# Patient Record
Sex: Female | Born: 1970 | Race: Black or African American | Hispanic: No | Smoking: Former smoker
Health system: Southern US, Community
[De-identification: ages and names within clinical notes are randomized; demographics above are authoritative.]

## PROBLEM LIST (undated history)

## (undated) DIAGNOSIS — G43909 Migraine, unspecified, not intractable, without status migrainosus: Secondary | ICD-10-CM

## (undated) HISTORY — PX: DILATION AND CURETTAGE OF UTERUS: SHX78

---

## 2017-02-13 ENCOUNTER — Emergency Department (HOSPITAL_BASED_OUTPATIENT_CLINIC_OR_DEPARTMENT_OTHER)
Admission: EM | Admit: 2017-02-13 | Discharge: 2017-02-13 | Disposition: A | Discharge status: Home / Self Care | Payer: Self-pay | Attending: Emergency Medicine | Admitting: Emergency Medicine

## 2017-02-13 ENCOUNTER — Encounter (HOSPITAL_BASED_OUTPATIENT_CLINIC_OR_DEPARTMENT_OTHER): Payer: Self-pay | Admitting: Emergency Medicine

## 2017-02-13 DIAGNOSIS — IMO0002 Reserved for concepts with insufficient information to code with codable children: Secondary | ICD-10-CM

## 2017-02-13 DIAGNOSIS — M5126 Other intervertebral disc displacement, lumbar region: Secondary | ICD-10-CM | POA: Insufficient documentation

## 2017-02-13 DIAGNOSIS — Y929 Unspecified place or not applicable: Secondary | ICD-10-CM | POA: Insufficient documentation

## 2017-02-13 DIAGNOSIS — X58XXXA Exposure to other specified factors, initial encounter: Secondary | ICD-10-CM | POA: Insufficient documentation

## 2017-02-13 DIAGNOSIS — Y939 Activity, unspecified: Secondary | ICD-10-CM | POA: Insufficient documentation

## 2017-02-13 DIAGNOSIS — Y999 Unspecified external cause status: Secondary | ICD-10-CM | POA: Insufficient documentation

## 2017-02-13 MED ORDER — ONDANSETRON HCL 4 MG/2ML IJ SOLN
4.0000 mg | Freq: Once | INTRAMUSCULAR | Status: AC
  Administered 2017-02-13: 4 mg via INTRAVENOUS
  Filled 2017-02-13: qty 2

## 2017-02-13 MED ORDER — HYDROMORPHONE HCL 1 MG/ML IJ SOLN
1.0000 mg | Freq: Once | INTRAMUSCULAR | Status: AC
  Administered 2017-02-13: 1 mg via INTRAVENOUS
  Filled 2017-02-13: qty 1

## 2017-02-13 MED ORDER — KETOROLAC TROMETHAMINE 30 MG/ML IJ SOLN
30.0000 mg | Freq: Once | INTRAMUSCULAR | Status: AC
  Administered 2017-02-13: 30 mg via INTRAVENOUS
  Filled 2017-02-13: qty 1

## 2017-02-13 MED ORDER — PREDNISONE 20 MG PO TABS: ORAL_TABLET | ORAL | 0 refills | Status: AC

## 2017-02-13 MED ORDER — PREDNISONE 50 MG PO TABS
60.0000 mg | ORAL_TABLET | Freq: Once | ORAL | Status: AC
  Administered 2017-02-13: 23:00:00 60 mg via ORAL
  Filled 2017-02-13: qty 1

## 2017-02-13 MED ORDER — OXYCODONE-ACETAMINOPHEN 5-325 MG PO TABS
2.0000 | ORAL_TABLET | Freq: Once | ORAL | Status: AC
  Administered 2017-02-13: 2 via ORAL
  Filled 2017-02-13: qty 2

## 2017-02-13 MED ORDER — OXYCODONE-ACETAMINOPHEN 5-325 MG PO TABS: 1.0000 | ORAL_TABLET | ORAL | 0 refills | Status: AC | PRN

## 2017-02-13 MED ORDER — CYCLOBENZAPRINE HCL 10 MG PO TABS: 10.0000 mg | ORAL_TABLET | Freq: Three times a day (TID) | ORAL | 0 refills | Status: AC

## 2017-02-13 MED ORDER — METHOCARBAMOL 1000 MG/10ML IJ SOLN
500.0000 mg | Freq: Once | INTRAMUSCULAR | Status: AC
  Administered 2017-02-13: 500 mg via INTRAMUSCULAR
  Filled 2017-02-13: qty 10

## 2017-02-13 NOTE — ED Provider Notes (Signed)
[facility]-EMERGENCY DEPT Provider Note   CSN: [id number] Arrival date & time: 02/13/17  1854     History   Chief Complaint Chief Complaint  Patient presents with  . Back Pain    HPI Lindsay Smith is a 46 y.o. female.  HPI Patient carried a microwave upstairs at about 11 AM. She reports she's felt something "squish in her lower back". She reports she was able to get the microwave up the stairs and set it down but after that she developed excruciating pain in her lower back. She reports it radiates into her left buttock and leg. She reports since it started she has not been able to get into any kind of comfortable position and has been in severe pain. She denies her left leg is weak although she reports is too painful for her to try to walk on it. She denies history of similar episode. History reviewed. No pertinent past medical history.  There are no active problems to display for this patient.   History reviewed. No pertinent surgical history.  OB History    No data available       Home Medications    Prior to Admission medications   Medication Sig Start Date End Date Taking? Authorizing Provider  cyclobenzaprine (FLEXERIL) 10 MG tablet Take 1 tablet (10 mg total) by mouth 3 (three) times daily. 02/13/17   Arby Barrette, MD  oxyCODONE-acetaminophen (PERCOCET) 5-325 MG tablet Take 1-2 tablets by mouth every 4 (four) hours as needed. 02/13/17   Arby Barrette, MD  predniSONE (DELTASONE) 20 MG tablet 3 tabs po daily x 3 days, then 2 tabs x 3 days, then 1.5 tabs x 3 days, then 1 tab x 3 days, then 0.5 tabs x 3 days 02/13/17   Arby Barrette, MD    Family History History reviewed. No pertinent family history.  Social History Social History  Substance Use Topics  . Smoking status: Never Smoker  . Smokeless tobacco: Never Used  . Alcohol use No     Allergies   Patient has no known allergies.   Review of Systems Review of Systems 10 Systems reviewed and are negative  for acute change except as noted in the HPI.  Physical Exam Updated Vital Signs BP 107/65 (BP Location: Right Arm)   Pulse (!) 54   Temp 98 F (36.7 C)   Resp 16   SpO2 100%   Physical Exam  Constitutional: She is oriented to person, place, and time. She appears well-developed and well-nourished.  Patient is tearful and lying on her left side. No respiratory distress. Nontoxic.  HENT:  Head: Normocephalic and atraumatic.  Eyes: Conjunctivae are normal.  Neck: Neck supple.  Cardiovascular: Normal rate and regular rhythm.   No murmur heard. Pulmonary/Chest: Effort normal and breath sounds normal. No respiratory distress.  Abdominal: Soft. She exhibits no distension. There is no tenderness.  Musculoskeletal: She exhibits tenderness. She exhibits no edema.  Patient ports pain to palpation of the lumbar spine from about L4-S1. She endorses pain in the left buttock. She endorses pain with any movement of the left lower extremity. No peripheral edema. Skin condition good.  Neurological: She is alert and oriented to person, place, and time. No cranial nerve deficit.  Skin: Skin is warm and dry.  Psychiatric: She has a normal mood and affect.  Nursing note and vitals reviewed.    ED Treatments / Results  Labs (all labs ordered are listed, but only abnormal results are displayed) Labs Reviewed - No  data to display  EKG  EKG Interpretation None       Radiology No results found.  Procedures Procedures (including critical care time)  Medications Ordered in ED Medications  HYDROmorphone (DILAUDID) injection 1 mg (1 mg Intravenous Given 02/13/17 2032)  ketorolac (TORADOL) 30 MG/ML injection 30 mg (30 mg Intravenous Given 02/13/17 2032)  methocarbamol (ROBAXIN) injection 500 mg (500 mg Intramuscular Given 02/13/17 2033)  ondansetron (ZOFRAN) injection 4 mg (4 mg Intravenous Given 02/13/17 2217)  predniSONE (DELTASONE) tablet 60 mg (60 mg Oral Given 02/13/17 2327)    oxyCODONE-acetaminophen (PERCOCET/ROXICET) 5-325 MG per tablet 2 tablet (2 tablets Oral Given 02/13/17 2327)     Initial Impression / Assessment and Plan / ED Course  I have reviewed the triage vital signs and the nursing notes.  Pertinent labs & imaging results that were available during my care of the patient were reviewed by me and considered in my medical decision making (see chart for details).     Final Clinical Impressions(s) / ED Diagnoses   Final diagnoses:  Herniated nucleus pulposus   Patient with acute and severe pain while carrying a microwave upstairs. Patient's pain pattern mechanism is suspicious for herniated disc. She does have severe pain which was ultimately controlled with Dilaudid Toradol and Robaxin. This allowed the patient to be ambulatory although she did have persisting pain. She is now able to transfer, walking and rest in relative comfort. We have discussed disc herniation. We've reviewed signs and symptoms first return that would suggest neurologic compromise. Patient is aware of the need for close follow-up to determine response to treatment with prednisone course and pain control. New Prescriptions Discharge Medication List as of 02/13/2017 11:24 PM    START taking these medications   Details  cyclobenzaprine (FLEXERIL) 10 MG tablet Take 1 tablet (10 mg total) by mouth 3 (three) times daily., Starting Tue 02/13/2017, Print    oxyCODONE-acetaminophen (PERCOCET) 5-325 MG tablet Take 1-2 tablets by mouth every 4 (four) hours as needed., Starting Tue 02/13/2017, Print    predniSONE (DELTASONE) 20 MG tablet 3 tabs po daily x 3 days, then 2 tabs x 3 days, then 1.5 tabs x 3 days, then 1 tab x 3 days, then 0.5 tabs x 3 days, Print         Arby BarrettePfeiffer, Perina Salvaggio, MD 02/19/17 1211

## 2017-02-13 NOTE — ED Notes (Signed)
Pt getting ride from [employer] to go home; says they will be here in 6 minutes.

## 2017-02-13 NOTE — ED Notes (Signed)
ED Provider at bedside. 

## 2017-02-13 NOTE — ED Triage Notes (Signed)
Per ems : patient was carrying a microwave up 3 flights of stairs and herd her back pop. The patient was given 100 mcg of fentanyl enroute

## 2017-02-13 NOTE — ED Notes (Signed)
Attempted to call pt's daughter no answer LVMM.

## 2017-02-14 ENCOUNTER — Telehealth (HOSPITAL_BASED_OUTPATIENT_CLINIC_OR_DEPARTMENT_OTHER): Payer: Self-pay | Admitting: *Deleted

## 2017-02-14 NOTE — Telephone Encounter (Signed)
[facility] dose and directions for percocet Rx. Chart reviewed by Dr. Denton LankSteinl who okayed Rx to be filled as written.

## 2017-11-13 ENCOUNTER — Other Ambulatory Visit: Payer: Self-pay

## 2017-11-13 ENCOUNTER — Emergency Department (HOSPITAL_COMMUNITY)
Admission: EM | Admit: 2017-11-13 | Discharge: 2017-11-13 | Disposition: A | Discharge status: Home / Self Care | Payer: Self-pay | Attending: Emergency Medicine | Admitting: Emergency Medicine

## 2017-11-13 ENCOUNTER — Emergency Department (HOSPITAL_COMMUNITY): Payer: Self-pay

## 2017-11-13 ENCOUNTER — Encounter (HOSPITAL_COMMUNITY): Payer: Self-pay | Admitting: *Deleted

## 2017-11-13 DIAGNOSIS — G43809 Other migraine, not intractable, without status migrainosus: Secondary | ICD-10-CM

## 2017-11-13 DIAGNOSIS — G43909 Migraine, unspecified, not intractable, without status migrainosus: Secondary | ICD-10-CM | POA: Insufficient documentation

## 2017-11-13 HISTORY — DX: Migraine, unspecified, not intractable, without status migrainosus: G43.909

## 2017-11-13 LAB — URINALYSIS, ROUTINE W REFLEX MICROSCOPIC
Bilirubin Urine: NEGATIVE
GLUCOSE, UA: NEGATIVE mg/dL
Ketones, ur: NEGATIVE mg/dL
Nitrite: NEGATIVE
PH: 6 (ref 5.0–8.0)
Protein, ur: NEGATIVE mg/dL
Specific Gravity, Urine: 1.009 (ref 1.005–1.030)

## 2017-11-13 LAB — BASIC METABOLIC PANEL
Anion gap: 8 (ref 5–15)
BUN: 7 mg/dL (ref 6–20)
CALCIUM: 9.2 mg/dL (ref 8.9–10.3)
CO2: 24 mmol/L (ref 22–32)
CREATININE: 0.78 mg/dL (ref 0.44–1.00)
Chloride: 106 mmol/L (ref 101–111)
GFR calc non Af Amer: >60 mL/min (ref >60)
GLUCOSE: 92 mg/dL (ref 65–99)
Potassium: 4 mmol/L (ref 3.5–5.1)
Sodium: 138 mmol/L (ref 135–145)

## 2017-11-13 LAB — I-STAT BETA HCG BLOOD, ED (MC, WL, AP ONLY): I-stat hCG, quantitative: <5 m[IU]/mL (ref <5)

## 2017-11-13 LAB — CBC
HCT: 37.2 % (ref 36.0–46.0)
Hemoglobin: 11.7 g/dL — ABNORMAL LOW (ref 12.0–15.0)
MCH: 25.7 pg — AB (ref 26.0–34.0)
MCHC: 31.5 g/dL (ref 30.0–36.0)
MCV: 81.6 fL (ref 78.0–100.0)
PLATELETS: 249 10*3/uL (ref 150–400)
RBC: 4.56 MIL/uL (ref 3.87–5.11)
RDW: 19.6 % — ABNORMAL HIGH (ref 11.5–15.5)
WBC: 3.5 10*3/uL — ABNORMAL LOW (ref 4.0–10.5)

## 2017-11-13 MED ORDER — SODIUM CHLORIDE 0.9 % IV BOLUS
1000.0000 mL | Freq: Once | INTRAVENOUS | Status: AC
  Administered 2017-11-13: 1000 mL via INTRAVENOUS

## 2017-11-13 MED ORDER — KETOROLAC TROMETHAMINE 30 MG/ML IJ SOLN
30.0000 mg | Freq: Once | INTRAMUSCULAR | Status: AC
  Administered 2017-11-13: 30 mg via INTRAVENOUS
  Filled 2017-11-13: qty 1

## 2017-11-13 NOTE — ED Notes (Signed)
Attempted IV x's 2 without success 

## 2017-11-13 NOTE — Discharge Instructions (Addendum)
Return here as needed.  Follow-up with a primary doctor.  Increase your fluid intake and rest as much as possible. °

## 2017-11-13 NOTE — ED Triage Notes (Signed)
To ED for eval of a few episodes of HA and dizziness. First started yesterday while watching TV. States she had double vision as well- thought this was a migraine so she took excedrin which relieved her dizziness and HA. Went to sleep. Was fine until she got to work this am when this returned. No meds taken this am. States she now feels fine. No neuro deficits noted.

## 2017-11-13 NOTE — ED Provider Notes (Signed)
[facility] EMERGENCY DEPARTMENT Provider Note   CSN: [id number] Arrival date & time: 11/13/17  0844     History   Chief Complaint Chief Complaint  Patient presents with  . Dizziness  . Headache    HPI Lindsay Smith is a 47 y.o. female.  HPI Patient presents to the emergency department with headache and dizziness that started 2 days ago.  The patient states that she had intermittent headache type sensation but also some blurriness of her vision and dizziness.  Patient states that she felt like she was very fatigued after working so many days in a row and then also was out with some friends and had alcohol.  She states that the main symptoms started yesterday.  Patient states she took Excedrin which relieved her dizziness and headache and then went to sleep and then states that she woke up today was fine and then started again at work.  The patient states that nothing seemed to make her condition worse. Past Medical History:  Diagnosis Date  . Migraine     There are no active problems to display for this patient.   History reviewed. No pertinent surgical history.   OB History   None      Home Medications    Prior to Admission medications   Medication Sig Start Date End Date Taking? Authorizing Provider  aspirin-acetaminophen-caffeine (EXCEDRIN MIGRAINE) [phone] MG tablet Take 1 tablet by mouth every 6 (six) hours as needed for headache.   Yes [provider]  cyclobenzaprine (FLEXERIL) 10 MG tablet Take 1 tablet (10 mg total) by mouth 3 (three) times daily. Patient not taking: Reported on 11/13/2017 02/13/17   Arby Barrette, MD  oxyCODONE-acetaminophen (PERCOCET) 5-325 MG tablet Take 1-2 tablets by mouth every 4 (four) hours as needed. Patient not taking: Reported on 11/13/2017 02/13/17   Arby Barrette, MD  predniSONE (DELTASONE) 20 MG tablet 3 tabs po daily x 3 days, then 2 tabs x 3 days, then 1.5 tabs x 3 days, then 1 tab x 3 days, then  0.5 tabs x 3 days Patient not taking: Reported on 11/13/2017 02/13/17   Arby Barrette, MD    Family History No family history on file.  Social History Social History   Tobacco Use  . Smoking status: Never Smoker  . Smokeless tobacco: Never Used  Substance Use Topics  . Alcohol use: No  . Drug use: No     Allergies   Patient has no known allergies.   Review of Systems Review of Systems All other systems negative except as documented in the HPI. All pertinent positives and negatives as reviewed in the HPI.  Physical Exam Updated Vital Signs BP 114/63   Pulse (!) 57   Temp 98.2 F (36.8 C) (Oral)   Resp 15   SpO2 100%   Physical Exam  Constitutional: She is oriented to person, place, and time. She appears well-developed and well-nourished. No distress.  HENT:  Head: Normocephalic and atraumatic.  Mouth/Throat: Oropharynx is clear and moist.  Eyes: Pupils are equal, round, and reactive to light.  Neck: Normal range of motion. Neck supple.  Cardiovascular: Normal rate, regular rhythm and normal heart sounds. Exam reveals no gallop and no friction rub.  No murmur heard. Pulmonary/Chest: Effort normal and breath sounds normal. No respiratory distress. She has no wheezes.  Abdominal: Soft. Bowel sounds are normal. She exhibits no distension. There is no tenderness.  Neurological: She is alert and oriented to person, place, and time.  She has normal strength. She displays normal reflexes. She exhibits normal muscle tone. Coordination and gait normal. GCS eye subscore is 4. GCS verbal subscore is 5. GCS motor subscore is 6.  Skin: Skin is warm and dry. Capillary refill takes less than 2 seconds. No rash noted. No erythema.  Psychiatric: She has a normal mood and affect. Her behavior is normal.  Nursing note and vitals reviewed.    ED Treatments / Results  Labs (all labs ordered are listed, but only abnormal results are displayed) Labs Reviewed  CBC - Abnormal; Notable  for the following components:      Result Value   WBC 3.5 (*)    Hemoglobin 11.7 (*)    MCH 25.7 (*)    RDW 19.6 (*)    All other components within normal limits  URINALYSIS, ROUTINE W REFLEX MICROSCOPIC - Abnormal; Notable for the following components:   APPearance CLOUDY (*)    Hgb urine dipstick SMALL (*)    Leukocytes, UA SMALL (*)    Bacteria, UA RARE (*)    All other components within normal limits  BASIC METABOLIC PANEL  I-STAT BETA HCG BLOOD, ED ([facility], [facility], [facility] ONLY)    EKG EKG Interpretation  Date/Time:  Tuesday November 13 2017 11:39:57 EDT Ventricular Rate:  45 PR Interval:    QRS Duration: 110 QT Interval:  438 QTC Calculation: 379 R Axis:   45 Text Interpretation:  Sinus bradycardia Borderline T abnormalities, anterior leads Baseline wander in lead(s) V6 No significant change since last tracing Confirmed by Gwyneth SproutPlunkett, Whitney ([id number]) on 11/13/2017 12:59:54 PM   Radiology Ct Head Wo Contrast  Result Date: 11/13/2017 CLINICAL DATA:  Headache, acute, severe.  Symptoms began yesterday. EXAM: CT HEAD WITHOUT CONTRAST TECHNIQUE: Contiguous axial images were obtained from the base of the skull through the vertex without intravenous contrast. COMPARISON:  None. FINDINGS: Brain: No evidence of acute infarction, hemorrhage, hydrocephalus, extra-axial collection or mass lesion/mass effect. Incidental globus pallidus calcification seen on the right. Vascular: No hyperdense vessel or unexpected calcification. Skull: Normal. Negative for fracture or focal lesion. Sinuses/Orbits: No acute finding. IMPRESSION: No acute finding or explanation for headache. Electronically Signed   By: Marnee SpringJonathon  Watts M.D.   On: 11/13/2017 15:01    Procedures Procedures (including critical care time)  Medications Ordered in ED Medications  sodium chloride 0.9 % bolus 1,000 mL (1,000 mLs Intravenous New Bag/Given 11/13/17 1536)  ketorolac (TORADOL) 30 MG/ML injection 30 mg (has no administration in time  range)     Initial Impression / Assessment and Plan / ED Course  I have reviewed the triage vital signs and the nursing notes.  Pertinent labs & imaging results that were available during my care of the patient were reviewed by me and considered in my medical decision making (see chart for details).     Feel that the patient's symptoms are multifactorial and most likely a migraine variant.  The patient has no neurological deficits noted on exam.  I feel that the fact that she was very fatigued after working 6 days in a row which is abnormal for her and the fact that she did go out and drink alcohol compounded her symptoms.  The patient will be treated for this told return here as needed.  Final Clinical Impressions(s) / ED Diagnoses   Final diagnoses:  None    ED Discharge Orders    None       Charlestine NightLawyer, Flordia Kassem, PA-C 11/13/17 1602    Plunkett, ChoptankWhitney,  MD 11/13/17 2055

## 2019-06-15 ENCOUNTER — Emergency Department (HOSPITAL_BASED_OUTPATIENT_CLINIC_OR_DEPARTMENT_OTHER): Payer: Self-pay

## 2019-06-15 ENCOUNTER — Encounter (HOSPITAL_BASED_OUTPATIENT_CLINIC_OR_DEPARTMENT_OTHER): Payer: Self-pay | Admitting: Emergency Medicine

## 2019-06-15 ENCOUNTER — Emergency Department (HOSPITAL_BASED_OUTPATIENT_CLINIC_OR_DEPARTMENT_OTHER)
Admission: EM | Admit: 2019-06-15 | Discharge: 2019-06-15 | Disposition: A | Discharge status: Home / Self Care | Payer: Self-pay | Attending: Emergency Medicine | Admitting: Emergency Medicine

## 2019-06-15 ENCOUNTER — Other Ambulatory Visit: Payer: Self-pay

## 2019-06-15 DIAGNOSIS — R079 Chest pain, unspecified: Secondary | ICD-10-CM | POA: Insufficient documentation

## 2019-06-15 DIAGNOSIS — Z20828 Contact with and (suspected) exposure to other viral communicable diseases: Secondary | ICD-10-CM | POA: Insufficient documentation

## 2019-06-15 DIAGNOSIS — R0602 Shortness of breath: Secondary | ICD-10-CM | POA: Insufficient documentation

## 2019-06-15 LAB — SARS CORONAVIRUS 2 AG (30 MIN TAT): SARS Coronavirus 2 Ag: NEGATIVE

## 2019-06-15 NOTE — ED Triage Notes (Signed)
Chest heaviness since yesterday. Concerned about COVID

## 2019-06-15 NOTE — Discharge Instructions (Addendum)
You are pending a Covid test.  Stay out of work until it comes back negative.

## 2019-06-15 NOTE — ED Provider Notes (Signed)
[facility] EMERGENCY DEPARTMENT Provider Note   CSN: [id number] Arrival date & time: 06/15/19  1210     History   Chief Complaint Chief Complaint  Patient presents with  . Chest Pain    HPI Lindsay Smith is a 48 y.o. female.     HPI Patient presents with chest heaviness.  Began yesterday.  Right side of her chest.  She does feel mildly short of breath with it.  States she is worried about Covid because 3 people at work have been positive for Covid recently.  States that she has had close contact particular with one of them but she was wearing a mask.  No fevers or chills.  No change in her taste or smell.  No abdominal pain.  No diarrhea. Past Medical History:  Diagnosis Date  . Migraine     There are no active problems to display for this patient.   History reviewed. No pertinent surgical history.   OB History   No obstetric history on file.      Home Medications    Prior to Admission medications   Medication Sig Start Date End Date Taking? Authorizing Provider  aspirin-acetaminophen-caffeine (EXCEDRIN MIGRAINE) [phone] MG tablet Take 1 tablet by mouth every 6 (six) hours as needed for headache.    [provider]  cyclobenzaprine (FLEXERIL) 10 MG tablet Take 1 tablet (10 mg total) by mouth 3 (three) times daily. Patient not taking: Reported on 11/13/2017 02/13/17   Charlesetta Shanks, MD  oxyCODONE-acetaminophen (PERCOCET) 5-325 MG tablet Take 1-2 tablets by mouth every 4 (four) hours as needed. Patient not taking: Reported on 11/13/2017 02/13/17   Charlesetta Shanks, MD  predniSONE (DELTASONE) 20 MG tablet 3 tabs po daily x 3 days, then 2 tabs x 3 days, then 1.5 tabs x 3 days, then 1 tab x 3 days, then 0.5 tabs x 3 days Patient not taking: Reported on 11/13/2017 02/13/17   Charlesetta Shanks, MD    Family History No family history on file.  Social History Social History   Tobacco Use  . Smoking status: Never Smoker  . Smokeless tobacco: Never  Used  Substance Use Topics  . Alcohol use: Yes  . Drug use: No     Allergies   Patient has no known allergies.   Review of Systems Review of Systems  Constitutional: Negative for appetite change and chills.  HENT: Negative for congestion and sore throat.   Respiratory: Positive for shortness of breath.   Cardiovascular: Positive for chest pain.  Gastrointestinal: Negative for abdominal distention.  Genitourinary: Negative for dysuria.  Musculoskeletal: Negative for back pain.  Skin: Negative for rash.  Neurological: Negative for weakness.  Psychiatric/Behavioral: Negative for behavioral problems.     Physical Exam Updated Vital Signs BP 102/83   Pulse (!) 57   Temp 99.1 F (37.3 C) (Oral)   Resp 18   Ht 5\' 4"  (1.626 m)   Wt 65.8 kg   LMP 05/29/2019   SpO2 100%   BMI 24.89 kg/m   Physical Exam Vitals signs reviewed.  HENT:     Head: Normocephalic.  Cardiovascular:     Rate and Rhythm: Normal rate and regular rhythm.  Pulmonary:     Breath sounds: No wheezing, rhonchi or rales.  Abdominal:     Tenderness: There is no abdominal tenderness.  Musculoskeletal:     Right lower leg: No edema.     Left lower leg: No edema.  Skin:    General: Skin is  warm.     Capillary Refill: Capillary refill takes less than 2 seconds.  Neurological:     Mental Status: She is alert.      ED Treatments / Results  Labs (all labs ordered are listed, but only abnormal results are displayed) Labs Reviewed  SARS CORONAVIRUS 2 AG (30 MIN TAT)  NOVEL CORONAVIRUS, NAA (HOSP ORDER, SEND-OUT TO REF LAB; TAT 18-24 HRS)    EKG EKG Interpretation  Date/Time:  Sunday June 15 2019 12:15:04 EST Ventricular Rate:  58 PR Interval:  140 QRS Duration: 84 QT Interval:  422 QTC Calculation: 414 R Axis:   57 Text Interpretation: Sinus bradycardia Cannot rule out Anterior infarct , age undetermined Abnormal ECG No acute changes Confirmed by Benjiman Core [phone]) on 06/15/2019  1:09:13 PM   Radiology Dg Chest Portable 1 View  Result Date: 06/15/2019 CLINICAL DATA:  Chest pain EXAM: PORTABLE CHEST 1 VIEW COMPARISON:  None. FINDINGS: The heart size and mediastinal contours are within normal limits. Both lungs are clear. The visualized skeletal structures are unremarkable. IMPRESSION: No active disease. Electronically Signed   By: Duanne Guess M.D.   On: 06/15/2019 13:38    Procedures Procedures (including critical care time)  Medications Ordered in ED Medications - No data to display   Initial Impression / Assessment and Plan / ED Course  I have reviewed the triage vital signs and the nursing notes.  Pertinent labs & imaging results that were available during my care of the patient were reviewed by me and considered in my medical decision making (see chart for details).        Patient presents with dull right-sided chest pain.  Mild shortness of breath.  Is worried because she had potential Covid exposure at work, although she was always wearing a mask.  X-ray reassuring.  Covid antigen test negative but follow-up PCR has not resulted yet.  I think she is stable for discharge home.  Instructed to isolate until repeat test comes back and inform the patient that she still potentially could be positive if it is still to early to show a positive test.  Final Clinical Impressions(s) / ED Diagnoses   Final diagnoses:  Nonspecific chest pain    ED Discharge Orders    None       Benjiman Core, MD 06/15/19 [id number]

## 2019-06-17 LAB — NOVEL CORONAVIRUS, NAA (HOSP ORDER, SEND-OUT TO REF LAB; TAT 18-24 HRS): SARS-CoV-2, NAA: NOT DETECTED

## 2019-06-18 ENCOUNTER — Telehealth (HOSPITAL_COMMUNITY): Payer: Self-pay

## 2019-10-31 ENCOUNTER — Ambulatory Visit (INDEPENDENT_AMBULATORY_CARE_PROVIDER_SITE_OTHER): Payer: BC Managed Care – PPO | Admitting: Family Medicine

## 2019-10-31 ENCOUNTER — Other Ambulatory Visit (HOSPITAL_COMMUNITY)
Admission: RE | Admit: 2019-10-31 | Discharge: 2019-10-31 | Disposition: A | Discharge status: Home / Self Care | Payer: BC Managed Care – PPO | Source: Ambulatory Visit | Attending: Family Medicine | Admitting: Family Medicine

## 2019-10-31 ENCOUNTER — Encounter: Payer: Self-pay | Admitting: Family Medicine

## 2019-10-31 ENCOUNTER — Other Ambulatory Visit: Payer: Self-pay

## 2019-10-31 VITALS — BP 111/68 | Ht 69.5 in | Wt 161.0 lb

## 2019-10-31 DIAGNOSIS — Z1272 Encounter for screening for malignant neoplasm of vagina: Secondary | ICD-10-CM | POA: Diagnosis not present

## 2019-10-31 DIAGNOSIS — Z01419 Encounter for gynecological examination (general) (routine) without abnormal findings: Secondary | ICD-10-CM | POA: Diagnosis not present

## 2019-10-31 DIAGNOSIS — N946 Dysmenorrhea, unspecified: Secondary | ICD-10-CM

## 2019-10-31 NOTE — Progress Notes (Signed)
GYNECOLOGY ANNUAL PREVENTATIVE CARE ENCOUNTER NOTE  Subjective:   Lindsay Smith is a 49 y.o. G41P2020 female here for a routine annual gynecologic exam.  Current complaints: Having regular periods. Bleeds for 3 days, then spotting for a day, then may have another couple days of heavier bleeding. Has history of uterine fibroids. Had an ablasion in 30s. Did have a period for awhile, then started to come back. Denies abnormal vaginal bleeding, discharge, pelvic pain, problems with intercourse or other gynecologic concerns.    Gynecologic History Patient's last menstrual period was 10/24/2019 (exact date). Patient is not sexually active  Contraception: tubal ligation Last Pap: uncertain. Results were: normal Last mammogram: uncertain. Results were: normal  Obstetric History OB History  Gravida Para Term Preterm AB Living  5 2 2   2     SAB TAB Ectopic Multiple Live Births          2    # Outcome Date GA Lbr Len/2nd Weight Sex Delivery Anes PTL Lv  5 Gravida           4 Term      CS-Classical     3 Term      Vag-Spont     2 AB           1 AB             Past Medical History:  Diagnosis Date  . Migraine     Past Surgical History:  Procedure Laterality Date  . DILATION AND CURETTAGE OF UTERUS      Current Outpatient Medications on File Prior to Visit  Medication Sig Dispense Refill  . aspirin-acetaminophen-caffeine (EXCEDRIN MIGRAINE) 250-250-65 MG tablet Take 1 tablet by mouth every 6 (six) hours as needed for headache.    . cyclobenzaprine (FLEXERIL) 10 MG tablet Take 1 tablet (10 mg total) by mouth 3 (three) times daily. (Patient not taking: Reported on 11/13/2017) 20 tablet 0  . oxyCODONE-acetaminophen (PERCOCET) 5-325 MG tablet Take 1-2 tablets by mouth every 4 (four) hours as needed. (Patient not taking: Reported on 11/13/2017) 20 tablet 0  . predniSONE (DELTASONE) 20 MG tablet 3 tabs po daily x 3 days, then 2 tabs x 3 days, then 1.5 tabs x 3 days, then 1 tab x 3 days,  then 0.5 tabs x 3 days (Patient not taking: Reported on 11/13/2017) 27 tablet 0   No current facility-administered medications on file prior to visit.    No Known Allergies  Social History   Socioeconomic History  . Marital status: Divorced    Spouse name: Not on file  . Number of children: Not on file  . Years of education: Not on file  . Highest education level: Not on file  Occupational History  . Not on file  Tobacco Use  . Smoking status: Former Smoker    Types: Cigarettes    Quit date: 10/31/2011    Years since quitting: 8.0  . Smokeless tobacco: Never Used  Substance and Sexual Activity  . Alcohol use: Yes  . Drug use: No  . Sexual activity: Not Currently  Other Topics Concern  . Not on file  Social History Narrative  . Not on file   Social Determinants of Health   Financial Resource Strain:   . Difficulty of Paying Living Expenses:   Food Insecurity:   . Worried About 12/31/2011 in the Last Year:   . Programme researcher, broadcasting/film/video in the Last Year:   Transportation Needs:   .  Lack of Transportation (Medical):   Marland Kitchen Lack of Transportation (Non-Medical):   Physical Activity:   . Days of Exercise per Week:   . Minutes of Exercise per Session:   Stress:   . Feeling of Stress :   Social Connections:   . Frequency of Communication with Friends and Family:   . Frequency of Social Gatherings with Friends and Family:   . Attends Religious Services:   . Active Member of Clubs or Organizations:   . Attends Archivist Meetings:   Marland Kitchen Marital Status:   Intimate Partner Violence:   . Fear of Current or Ex-Partner:   . Emotionally Abused:   Marland Kitchen Physically Abused:   . Sexually Abused:     Family History  Problem Relation Age of Onset  . Uterine cancer Maternal Grandmother   . Cancer Mother   . Breast cancer Mother   . Uterine cancer Mother   . Diabetes Neg Hx   . Hypertension Neg Hx     The following portions of the patient's history were reviewed and updated  as appropriate: allergies, current medications, past family history, past medical history, past social history, past surgical history and problem list.  Review of Systems Pertinent items are noted in HPI.   Objective:  BP 111/68 (BP Location: Left Arm, Patient Position: Sitting)   Ht 5' 9.5" (1.765 m)   Wt 161 lb (73 kg)   LMP 10/24/2019 (Exact Date)   BMI 23.43 kg/m  Wt Readings from Last 3 Encounters:  10/31/19 161 lb (73 kg)  06/15/19 145 lb (65.8 kg)     Chaperone present during exam  CONSTITUTIONAL: Well-developed, well-nourished female in no acute distress.  HENT:  Normocephalic, atraumatic, External right and left ear normal. Oropharynx is clear and moist EYES: Conjunctivae and EOM are normal. Pupils are equal, round, and reactive to light. No scleral icterus.  NECK: Normal range of motion, supple, no masses.  Normal thyroid.   CARDIOVASCULAR: Normal heart rate noted, regular rhythm RESPIRATORY: Clear to auscultation bilaterally. Effort and breath sounds normal, no problems with respiration noted. BREASTS: Symmetric in size. No masses, skin changes, nipple drainage, or lymphadenopathy. ABDOMEN: Soft, normal bowel sounds, no distention noted.  No tenderness, rebound or guarding.  PELVIC: Normal appearing external genitalia; normal appearing vaginal mucosa and cervix.  No abnormal discharge noted.  Uterus enlarged to about 15 week size, no uterine or adnexal tenderness. MUSCULOSKELETAL: Normal range of motion. No tenderness.  No cyanosis, clubbing, or edema.  2+ distal pulses. SKIN: Skin is warm and dry. No rash noted. Not diaphoretic. No erythema. No pallor. NEUROLOGIC: Alert and oriented to person, place, and time. Normal reflexes, muscle tone coordination. No cranial nerve deficit noted. PSYCHIATRIC: Normal mood and affect. Normal behavior. Normal judgment and thought content.  Assessment:  Annual gynecologic examination with pap smear   Plan:  1. Well Woman Exam Will  follow up results of pap smear and manage accordingly. Mammogram scheduled STD testing discussed. Patient declined testing - Cytology - PAP( [place]) - MM Digital Screening; Future  2. Dysmenorrhea Check [place]. Discussed that this pattern is sometime normal after ablation. Likely has fibroids.   Routine preventative health maintenance measures emphasized. Please refer to After Visit Summary for other counseling recommendations.    Loma Boston, [place] for [employer]

## 2019-11-03 ENCOUNTER — Other Ambulatory Visit: Payer: Self-pay

## 2019-11-03 ENCOUNTER — Encounter (HOSPITAL_BASED_OUTPATIENT_CLINIC_OR_DEPARTMENT_OTHER): Payer: Self-pay

## 2019-11-03 ENCOUNTER — Ambulatory Visit (HOSPITAL_BASED_OUTPATIENT_CLINIC_OR_DEPARTMENT_OTHER)
Admission: RE | Admit: 2019-11-03 | Discharge: 2019-11-03 | Disposition: A | Discharge status: Home / Self Care | Payer: BC Managed Care – PPO | Source: Ambulatory Visit | Attending: Family Medicine | Admitting: Family Medicine

## 2019-11-03 DIAGNOSIS — N946 Dysmenorrhea, unspecified: Secondary | ICD-10-CM | POA: Diagnosis not present

## 2019-11-03 DIAGNOSIS — Z01419 Encounter for gynecological examination (general) (routine) without abnormal findings: Secondary | ICD-10-CM | POA: Diagnosis not present

## 2019-11-03 DIAGNOSIS — D259 Leiomyoma of uterus, unspecified: Secondary | ICD-10-CM | POA: Diagnosis not present

## 2019-11-03 DIAGNOSIS — Z1231 Encounter for screening mammogram for malignant neoplasm of breast: Secondary | ICD-10-CM | POA: Diagnosis not present

## 2019-11-04 LAB — CYTOLOGY - PAP
Comment: NEGATIVE
Diagnosis: NEGATIVE
High risk HPV: NEGATIVE

## 2019-11-05 ENCOUNTER — Other Ambulatory Visit: Payer: Self-pay | Admitting: Family Medicine

## 2019-11-05 DIAGNOSIS — R928 Other abnormal and inconclusive findings on diagnostic imaging of breast: Secondary | ICD-10-CM

## 2019-11-12 ENCOUNTER — Other Ambulatory Visit: Payer: Self-pay | Admitting: Family Medicine

## 2019-11-12 ENCOUNTER — Other Ambulatory Visit: Payer: Self-pay

## 2019-11-12 ENCOUNTER — Ambulatory Visit
Admission: RE | Admit: 2019-11-12 | Discharge: 2019-11-12 | Disposition: A | Discharge status: Home / Self Care | Payer: BC Managed Care – PPO | Source: Ambulatory Visit | Attending: Family Medicine | Admitting: Family Medicine

## 2019-11-12 DIAGNOSIS — R928 Other abnormal and inconclusive findings on diagnostic imaging of breast: Secondary | ICD-10-CM

## 2019-11-12 DIAGNOSIS — N6322 Unspecified lump in the left breast, upper inner quadrant: Secondary | ICD-10-CM | POA: Diagnosis not present

## 2019-11-12 DIAGNOSIS — N6324 Unspecified lump in the left breast, lower inner quadrant: Secondary | ICD-10-CM | POA: Diagnosis not present

## 2019-11-12 DIAGNOSIS — N6311 Unspecified lump in the right breast, upper outer quadrant: Secondary | ICD-10-CM | POA: Diagnosis not present

## 2019-11-12 DIAGNOSIS — R922 Inconclusive mammogram: Secondary | ICD-10-CM | POA: Diagnosis not present

## 2019-11-17 ENCOUNTER — Ambulatory Visit
Admission: RE | Admit: 2019-11-17 | Discharge: 2019-11-17 | Disposition: A | Discharge status: Home / Self Care | Payer: BC Managed Care – PPO | Source: Ambulatory Visit | Attending: Family Medicine | Admitting: Family Medicine

## 2019-11-17 ENCOUNTER — Other Ambulatory Visit: Payer: Self-pay

## 2019-11-17 DIAGNOSIS — N62 Hypertrophy of breast: Secondary | ICD-10-CM | POA: Diagnosis not present

## 2019-11-17 DIAGNOSIS — N6311 Unspecified lump in the right breast, upper outer quadrant: Secondary | ICD-10-CM | POA: Diagnosis not present

## 2019-11-17 DIAGNOSIS — R928 Other abnormal and inconclusive findings on diagnostic imaging of breast: Secondary | ICD-10-CM

## 2019-12-11 DIAGNOSIS — F332 Major depressive disorder, recurrent severe without psychotic features: Secondary | ICD-10-CM | POA: Diagnosis not present

## 2019-12-11 DIAGNOSIS — F4323 Adjustment disorder with mixed anxiety and depressed mood: Secondary | ICD-10-CM | POA: Diagnosis not present

## 2019-12-11 DIAGNOSIS — F411 Generalized anxiety disorder: Secondary | ICD-10-CM | POA: Diagnosis not present

## 2019-12-26 DIAGNOSIS — F4323 Adjustment disorder with mixed anxiety and depressed mood: Secondary | ICD-10-CM | POA: Diagnosis not present

## 2019-12-26 DIAGNOSIS — F332 Major depressive disorder, recurrent severe without psychotic features: Secondary | ICD-10-CM | POA: Diagnosis not present

## 2019-12-26 DIAGNOSIS — F411 Generalized anxiety disorder: Secondary | ICD-10-CM | POA: Diagnosis not present

## 2019-12-26 DIAGNOSIS — Z79899 Other long term (current) drug therapy: Secondary | ICD-10-CM | POA: Diagnosis not present

## 2020-01-12 DIAGNOSIS — F332 Major depressive disorder, recurrent severe without psychotic features: Secondary | ICD-10-CM | POA: Diagnosis not present

## 2020-01-12 DIAGNOSIS — F411 Generalized anxiety disorder: Secondary | ICD-10-CM | POA: Diagnosis not present

## 2020-01-12 DIAGNOSIS — Z634 Disappearance and death of family member: Secondary | ICD-10-CM | POA: Diagnosis not present

## 2020-01-12 DIAGNOSIS — Z79899 Other long term (current) drug therapy: Secondary | ICD-10-CM | POA: Diagnosis not present

## 2020-01-27 DIAGNOSIS — Z79899 Other long term (current) drug therapy: Secondary | ICD-10-CM | POA: Diagnosis not present

## 2020-01-27 DIAGNOSIS — F411 Generalized anxiety disorder: Secondary | ICD-10-CM | POA: Diagnosis not present

## 2020-01-27 DIAGNOSIS — Z634 Disappearance and death of family member: Secondary | ICD-10-CM | POA: Diagnosis not present

## 2020-01-27 DIAGNOSIS — F332 Major depressive disorder, recurrent severe without psychotic features: Secondary | ICD-10-CM | POA: Diagnosis not present

## 2021-10-27 IMAGING — MG MM BREAST LOCALIZATION CLIP
4 series · 4 of 12 positions shown · non-contrast
Comparison: Previous exam(s).

CLINICAL DATA: Evaluate RIBBON clip placement following
ultrasound-guided RIGHT breast biopsy.

EXAM:
DIAGNOSTIC RIGHT MAMMOGRAM POST ULTRASOUND BIOPSY

[R ML synth-2D]
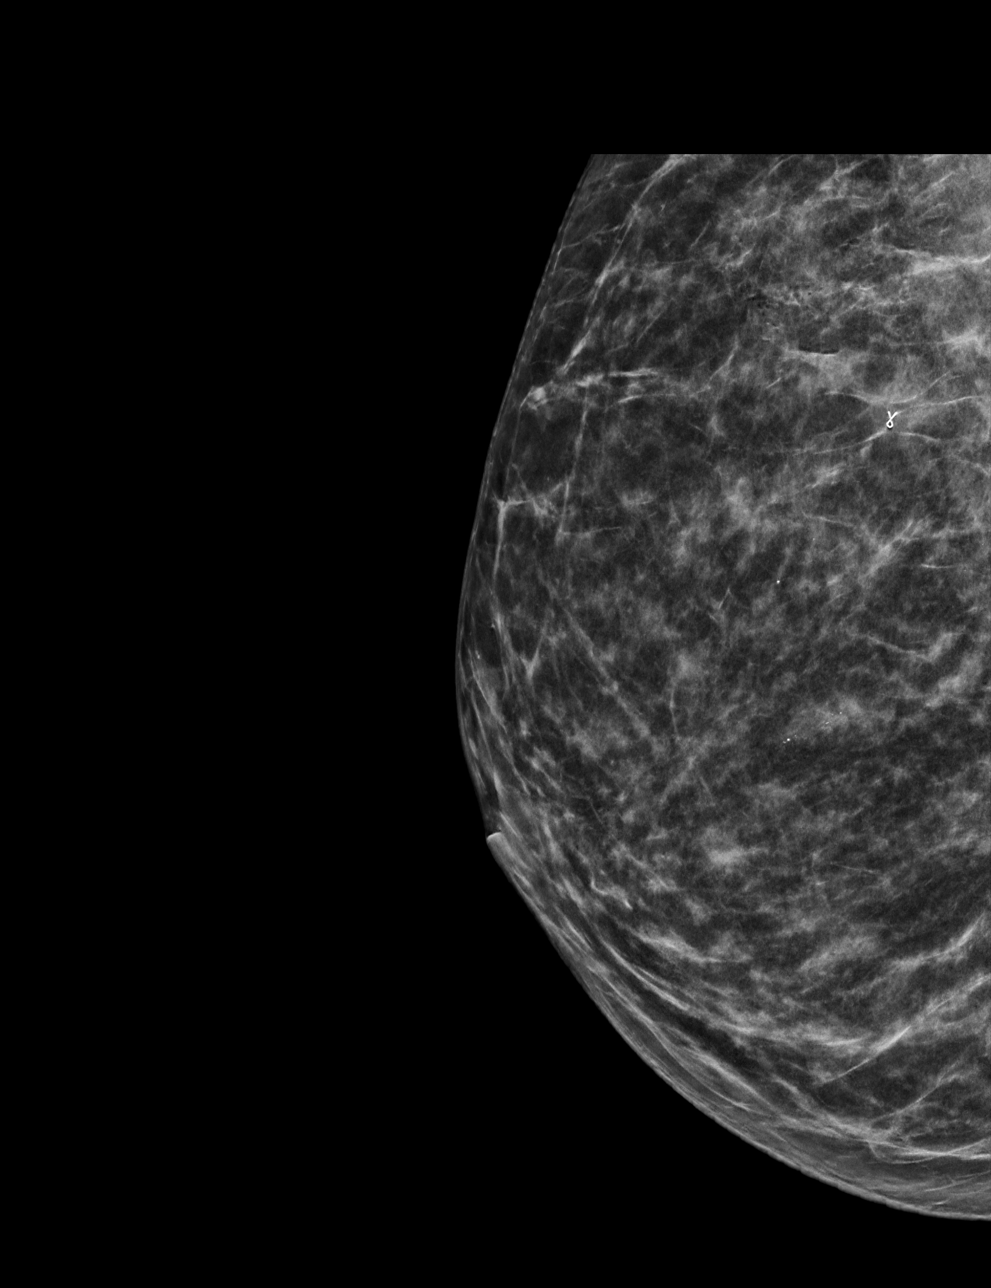

[R CC synth-2D]
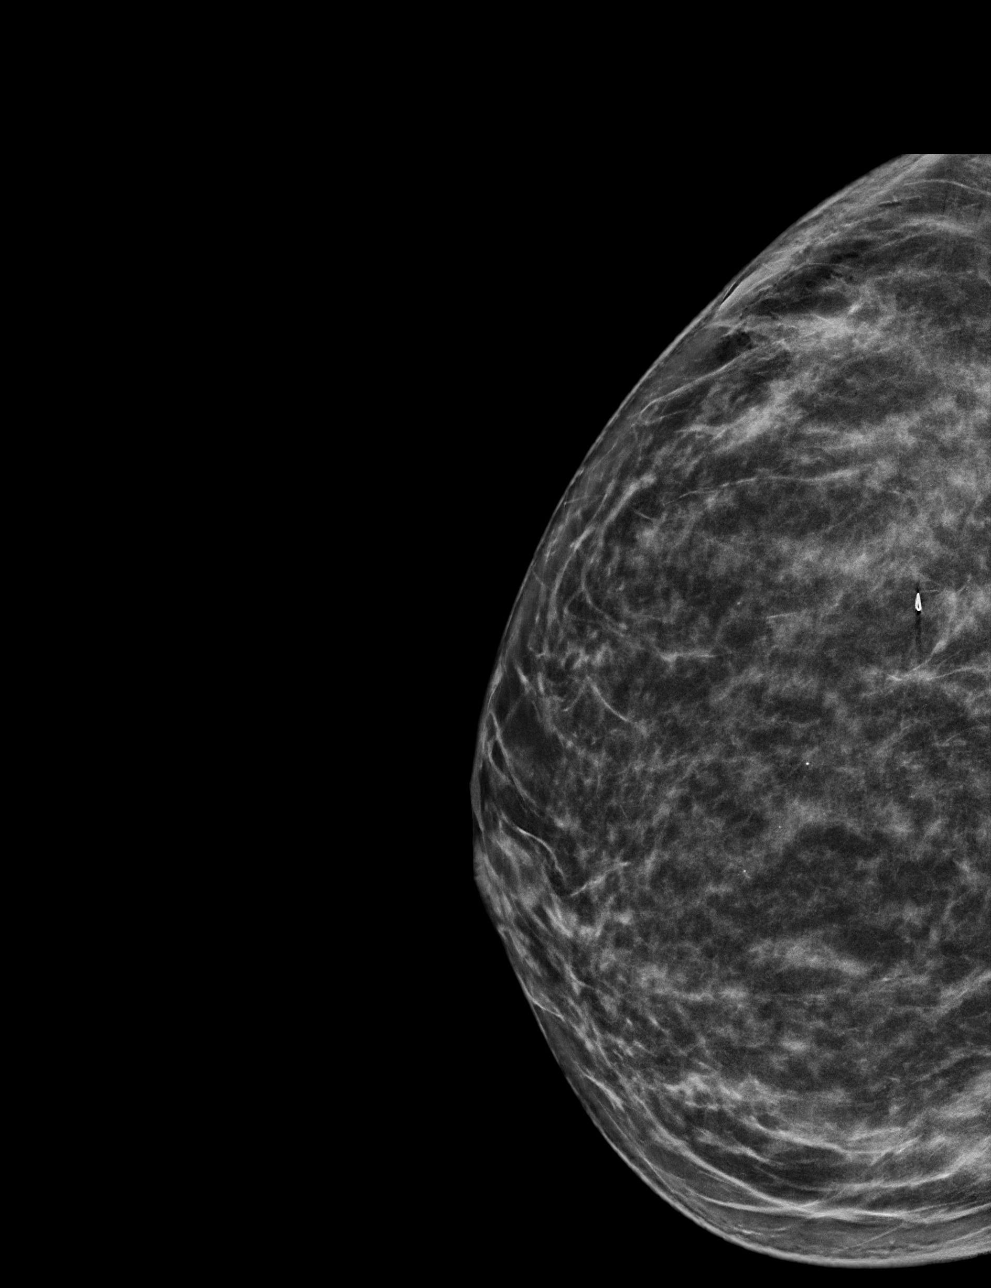

[R CC tomo · tomo slice 33/65.0]
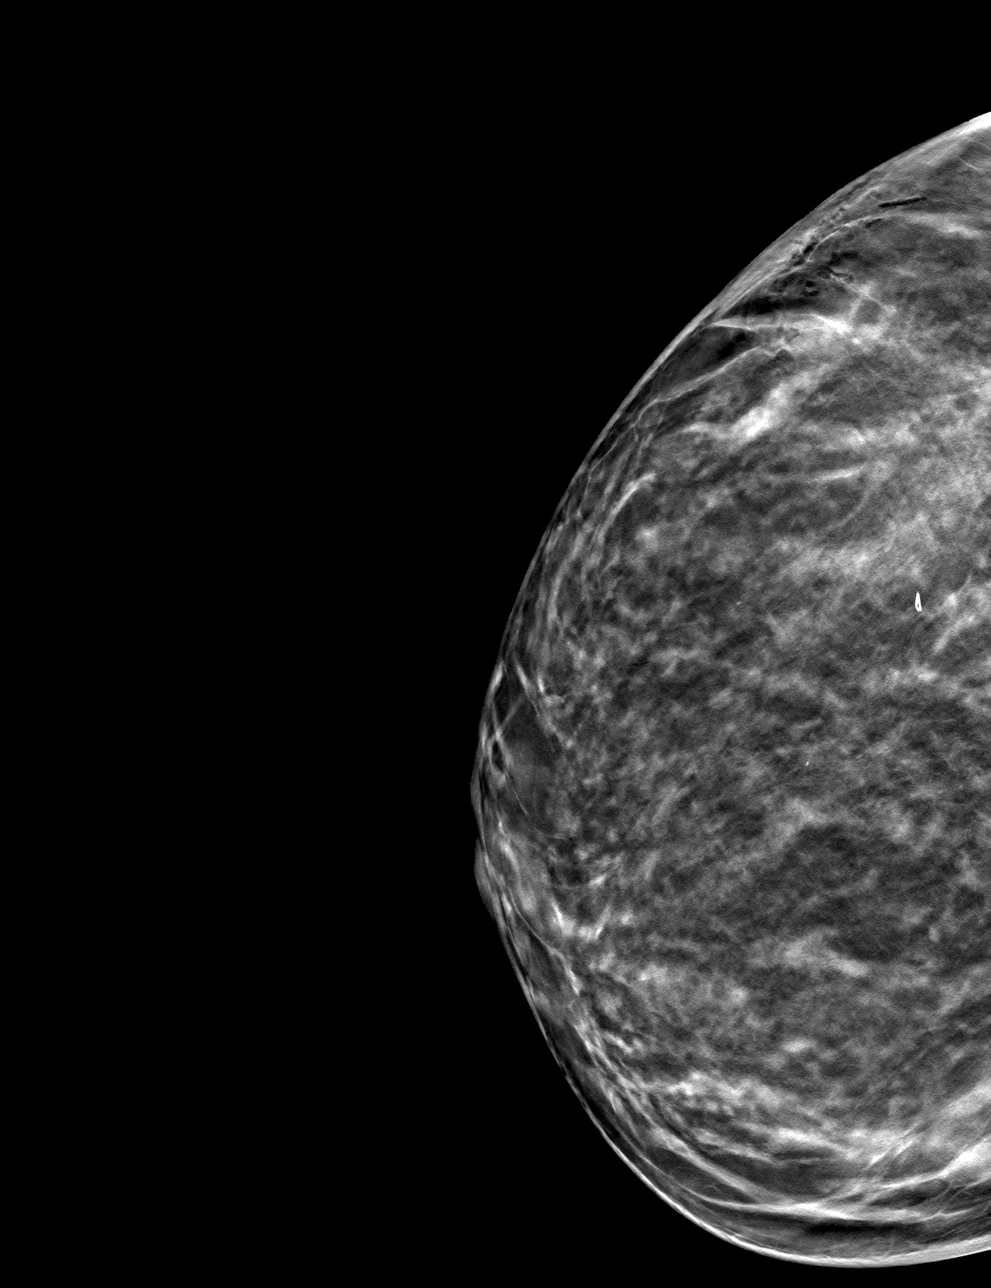

[R ML tomo · tomo slice 30/59.0]
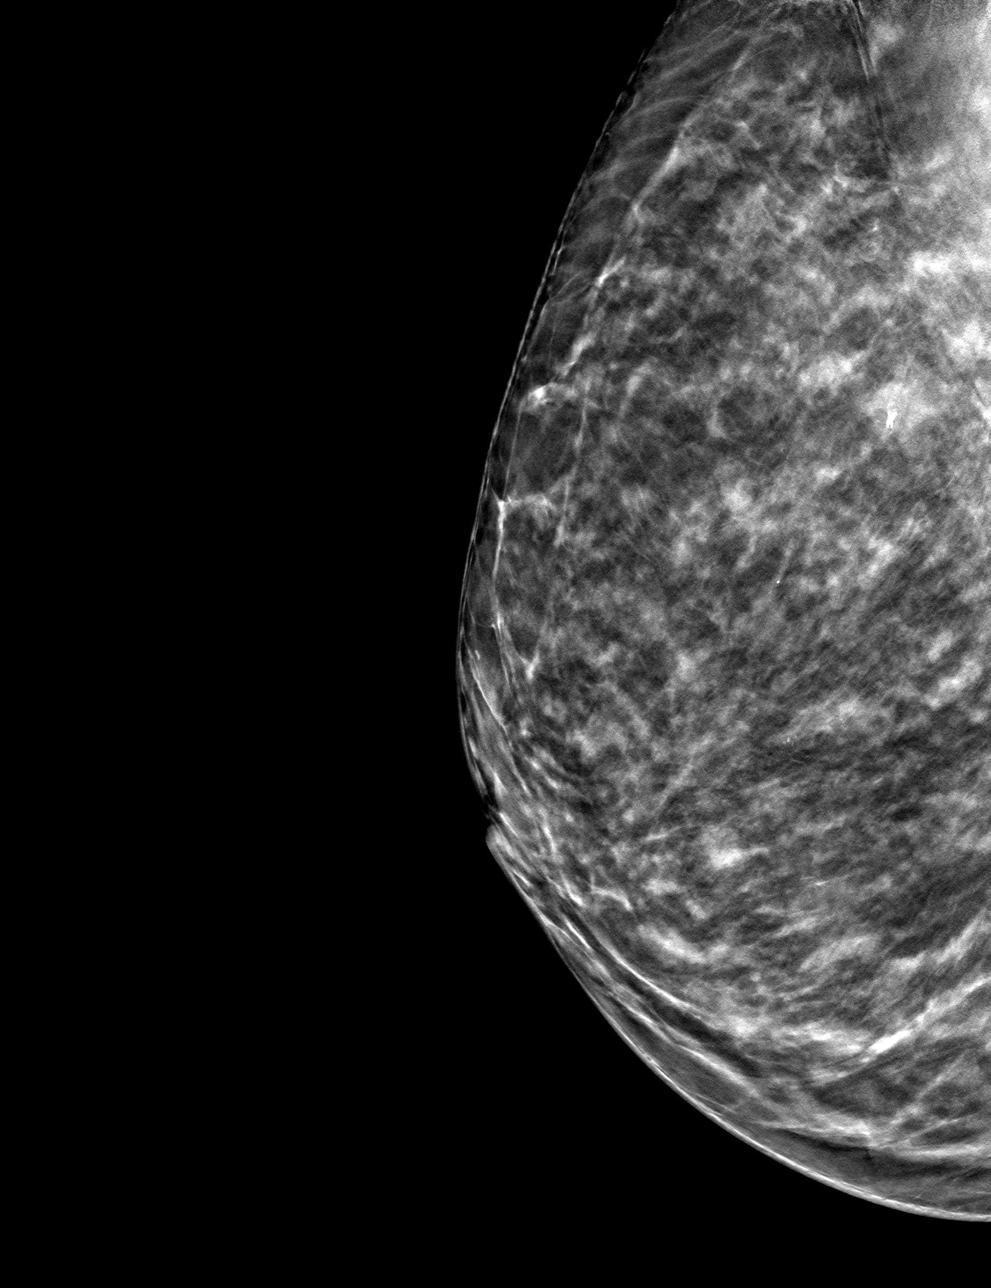

[4 of 12 positions shown; findings below may reference images not displayed]

FINDINGS: Mammographic images were obtained following ultrasound guided biopsy
of the 0.9 cm mass at the [DATE] position of the RIGHT breast. The
RIBBON biopsy marking clip is in expected position at the site of
biopsy.
IMPRESSION: Appropriate positioning of the RIBBON shaped biopsy marking clip at
the site of biopsy in the UPPER OUTER RIGHT breast.

Final Assessment: Post Procedure Mammograms for Marker Placement
